# Patient Record
Sex: Female | Born: 1975 | Race: White | Hispanic: No | Marital: Married | Smoking: Never smoker
Health system: Southern US, Community
[De-identification: ages and names within clinical notes are randomized; demographics above are authoritative.]

## PROBLEM LIST (undated history)

## (undated) DIAGNOSIS — I499 Cardiac arrhythmia, unspecified: Secondary | ICD-10-CM

## (undated) DIAGNOSIS — R0602 Shortness of breath: Secondary | ICD-10-CM

## (undated) DIAGNOSIS — R079 Chest pain, unspecified: Secondary | ICD-10-CM

## (undated) DIAGNOSIS — R000 Tachycardia, unspecified: Secondary | ICD-10-CM

## (undated) HISTORY — PX: CHOLECYSTECTOMY: SHX55

## (undated) HISTORY — DX: Chest pain, unspecified: R07.9

## (undated) HISTORY — DX: Shortness of breath: R06.02

## (undated) HISTORY — DX: Tachycardia, unspecified: R00.0

---

## 1998-07-13 ENCOUNTER — Encounter: Payer: Self-pay | Admitting: Obstetrics and Gynecology

## 1998-07-13 ENCOUNTER — Ambulatory Visit (HOSPITAL_COMMUNITY): Admission: RE | Admit: 1998-07-13 | Discharge: 1998-07-13 | Payer: Self-pay | Admitting: Obstetrics and Gynecology

## 1998-08-17 ENCOUNTER — Encounter: Payer: Self-pay | Admitting: Obstetrics and Gynecology

## 1998-08-17 ENCOUNTER — Ambulatory Visit (HOSPITAL_COMMUNITY): Admission: RE | Admit: 1998-08-17 | Discharge: 1998-08-17 | Payer: Self-pay | Admitting: Obstetrics and Gynecology

## 1998-10-19 ENCOUNTER — Ambulatory Visit (HOSPITAL_COMMUNITY): Admission: RE | Admit: 1998-10-19 | Discharge: 1998-10-19 | Payer: Self-pay | Admitting: Obstetrics and Gynecology

## 1998-10-19 ENCOUNTER — Encounter: Payer: Self-pay | Admitting: Obstetrics and Gynecology

## 1998-11-18 ENCOUNTER — Inpatient Hospital Stay (HOSPITAL_COMMUNITY): Admission: AD | Admit: 1998-11-18 | Discharge: 1998-11-18 | Payer: Self-pay | Admitting: Obstetrics and Gynecology

## 1998-12-06 ENCOUNTER — Inpatient Hospital Stay (HOSPITAL_COMMUNITY): Admission: AD | Admit: 1998-12-06 | Discharge: 1998-12-08 | Payer: Self-pay | Admitting: Obstetrics and Gynecology

## 1999-11-25 ENCOUNTER — Emergency Department (HOSPITAL_COMMUNITY): Admission: EM | Admit: 1999-11-25 | Discharge: 1999-11-25 | Payer: Self-pay | Admitting: Emergency Medicine

## 1999-11-25 ENCOUNTER — Emergency Department (HOSPITAL_COMMUNITY): Admission: EM | Admit: 1999-11-25 | Discharge: 1999-11-26 | Payer: Self-pay | Admitting: Emergency Medicine

## 1999-11-27 ENCOUNTER — Encounter: Payer: Self-pay | Admitting: General Practice

## 1999-11-27 ENCOUNTER — Encounter: Admission: RE | Admit: 1999-11-27 | Discharge: 1999-11-27 | Payer: Self-pay | Admitting: General Practice

## 1999-11-29 ENCOUNTER — Encounter: Payer: Self-pay | Admitting: General Surgery

## 1999-11-29 ENCOUNTER — Encounter: Admission: RE | Admit: 1999-11-29 | Discharge: 1999-11-29 | Payer: Self-pay | Admitting: General Surgery

## 1999-12-02 ENCOUNTER — Other Ambulatory Visit: Admission: RE | Admit: 1999-12-02 | Discharge: 1999-12-02 | Payer: Self-pay | Admitting: General Surgery

## 2000-04-10 ENCOUNTER — Other Ambulatory Visit: Admission: RE | Admit: 2000-04-10 | Discharge: 2000-04-10 | Payer: Self-pay | Admitting: Obstetrics and Gynecology

## 2001-04-27 ENCOUNTER — Other Ambulatory Visit: Admission: RE | Admit: 2001-04-27 | Discharge: 2001-04-27 | Payer: Self-pay | Admitting: Obstetrics and Gynecology

## 2001-11-12 ENCOUNTER — Other Ambulatory Visit: Admission: RE | Admit: 2001-11-12 | Discharge: 2001-11-12 | Payer: Self-pay | Admitting: Obstetrics and Gynecology

## 2001-12-22 ENCOUNTER — Ambulatory Visit (HOSPITAL_COMMUNITY): Admission: RE | Admit: 2001-12-22 | Discharge: 2001-12-22 | Payer: Self-pay | Admitting: Obstetrics and Gynecology

## 2001-12-22 ENCOUNTER — Encounter: Payer: Self-pay | Admitting: Obstetrics and Gynecology

## 2002-05-16 ENCOUNTER — Inpatient Hospital Stay (HOSPITAL_COMMUNITY): Admission: AD | Admit: 2002-05-16 | Discharge: 2002-05-19 | Payer: Self-pay | Admitting: Obstetrics and Gynecology

## 2003-08-31 ENCOUNTER — Other Ambulatory Visit: Admission: RE | Admit: 2003-08-31 | Discharge: 2003-08-31 | Payer: Self-pay | Admitting: Obstetrics and Gynecology

## 2003-09-06 ENCOUNTER — Inpatient Hospital Stay (HOSPITAL_COMMUNITY): Admission: AD | Admit: 2003-09-06 | Discharge: 2003-09-06 | Payer: Self-pay | Admitting: Obstetrics and Gynecology

## 2004-03-11 ENCOUNTER — Inpatient Hospital Stay (HOSPITAL_COMMUNITY): Admission: AD | Admit: 2004-03-11 | Discharge: 2004-03-13 | Payer: Self-pay | Admitting: Obstetrics and Gynecology

## 2004-12-12 ENCOUNTER — Emergency Department (HOSPITAL_COMMUNITY): Admission: EM | Admit: 2004-12-12 | Discharge: 2004-12-12 | Payer: Self-pay | Admitting: Emergency Medicine

## 2005-06-06 ENCOUNTER — Other Ambulatory Visit: Admission: RE | Admit: 2005-06-06 | Discharge: 2005-06-06 | Payer: Self-pay | Admitting: Obstetrics and Gynecology

## 2010-08-27 ENCOUNTER — Other Ambulatory Visit: Payer: Self-pay

## 2010-08-27 ENCOUNTER — Emergency Department (HOSPITAL_COMMUNITY)
Admission: EM | Admit: 2010-08-27 | Discharge: 2010-08-27 | Disposition: A | Discharge status: Home / Self Care | Payer: BC Managed Care – PPO | Attending: Emergency Medicine | Admitting: Emergency Medicine

## 2010-08-27 ENCOUNTER — Emergency Department (HOSPITAL_COMMUNITY): Payer: BC Managed Care – PPO

## 2010-08-27 ENCOUNTER — Encounter: Payer: Self-pay | Admitting: Emergency Medicine

## 2010-08-27 DIAGNOSIS — R071 Chest pain on breathing: Secondary | ICD-10-CM | POA: Insufficient documentation

## 2010-08-27 DIAGNOSIS — R0789 Other chest pain: Secondary | ICD-10-CM

## 2010-08-27 HISTORY — DX: Cardiac arrhythmia, unspecified: I49.9

## 2010-08-27 LAB — DIFFERENTIAL
Basophils Absolute: 0.1 10*3/uL (ref 0.0–0.1)
Basophils Relative: 1 % (ref 0–1)
Eosinophils Absolute: 0.1 10*3/uL (ref 0.0–0.7)
Eosinophils Relative: 2 % (ref 0–5)
Lymphocytes Relative: 34 % (ref 12–46)
Lymphs Abs: 2 10*3/uL (ref 0.7–4.0)
Neutro Abs: 3.4 10*3/uL (ref 1.7–7.7)
Neutrophils Relative %: 56 % (ref 43–77)
Promyelocytes Absolute: 0 %

## 2010-08-27 LAB — CBC
Platelets: 275 10*3/uL (ref 150–400)
RBC: 4.53 MIL/uL (ref 3.87–5.11)
WBC: 6 10*3/uL (ref 4.0–10.5)

## 2010-08-27 LAB — COMPREHENSIVE METABOLIC PANEL
AST: 21 U/L (ref 0–37)
Albumin: 3.8 g/dL (ref 3.5–5.2)
Calcium: 9.5 mg/dL (ref 8.4–10.5)
Creatinine, Ser: 0.57 mg/dL (ref 0.50–1.10)
Total Protein: 7.8 g/dL (ref 6.0–8.3)

## 2010-08-27 LAB — CARDIAC PANEL(CRET KIN+CKTOT+MB+TROPI)
Relative Index: INVALID (ref 0.0–2.5)
Total CK: 54 U/L (ref 7–177)
Troponin I: <0.3 ng/mL (ref <0.30)

## 2010-08-27 MED ORDER — IOHEXOL 350 MG/ML SOLN
100.0000 mL | Freq: Once | INTRAVENOUS | Status: AC | PRN
  Administered 2010-08-27: 100 mL via INTRAVENOUS

## 2010-08-27 MED ORDER — SODIUM CHLORIDE 0.9 % IJ SOLN: 3.0000 mL | INTRAMUSCULAR | Status: DC | PRN

## 2010-08-27 MED ORDER — SODIUM CHLORIDE 0.9 % IV SOLN
Freq: Once | INTRAVENOUS | Status: AC
  Administered 2010-08-27: 17:00:00 via INTRAVENOUS

## 2010-08-27 MED ORDER — IBUPROFEN 800 MG PO TABS: 800.0000 mg | ORAL_TABLET | Freq: Three times a day (TID) | ORAL | Status: AC

## 2010-08-27 NOTE — ED Notes (Signed)
INTO ROOM TO SEE PT. RESTING IN BED ON BACK WITH FAMILY AT BS. REMAINS EATING ICE CHIPS. NAD. PAIN 3/10. NO SOB. CALL BELL AT BS. DENIES ANY NEEDS. NO SOB. WILL CONT TO MONITOR.

## 2010-08-27 NOTE — ED Notes (Signed)
EDPA aware of patient's d-dimer, verbal order given for chest CT.

## 2010-08-27 NOTE — ED Notes (Signed)
md at bs with pt to discuss plan of care.

## 2010-08-27 NOTE — ED Notes (Signed)
INTO ROOM TO SEE PT. PT GIVEN ICE CHIPS PER REQUEST. DENIES ANY OTHER NEEDS. PAIN 3/10, BUT NOT CONSTANT. FAMILY AT BS. PT NOT HAVING ANY SOB. NAD. WILL CONT TO MONITOR. TOLERATING ICE CHIPS WELL.

## 2010-08-27 NOTE — ED Notes (Signed)
Patient c/o left side chest pain that radiates into left shoulder blade with left arm numbness. Patient also reported some shortness of breath and nausea but denies any vomiting, light headness, or diaphoresis. Per patient pain started before lunch. Airway patent. Respirations even and non labored, O2 sat 97% on room air. Lung sounds clear.

## 2010-08-27 NOTE — ED Notes (Signed)
Pt back to room from ct. Pt report given to oncoming shift. Assuming care of pt. Into room to introduce self to pt. Pt denies pain when resting. When moving around, pain 1\10. Denies sob. Pt nad at this time. Call bell at bs. Will cont to monitor.

## 2010-08-27 NOTE — ED Provider Notes (Signed)
History     No chief complaint on file.  Patient is a 35 y.o. female presenting with chest pain. The history is provided by the patient and the spouse. No language interpreter was used.  Chest Pain The chest pain began 3 - 5 hours ago. Chest pain occurs constantly. At its most intense, the pain is at 5/10. The pain is currently at 3/10. The quality of the pain is described as pleuritic and sharp. Pertinent negatives for primary symptoms include no fever, no palpitations, no nausea and no vomiting.  Pertinent negatives for associated symptoms include no diaphoresis.   pt states the pain radiates to the L scapula.  Initially it radiates down the L arm also but now the arm simply feels "numb" .  No associated trauma, n/v SOB, diaphoresis or pre-syncopal sx's.  Pain worse with palpation but not with deep inspiration or movement.  No past medical history on file.  No past surgical history on file.  No family history on file.  History  Substance Use Topics  . Smoking status: Not on file  . Smokeless tobacco: Not on file  . Alcohol Use: Not on file    OB History    No data available      Review of Systems  Constitutional: Negative for fever and diaphoresis.  Cardiovascular: Positive for chest pain. Negative for palpitations.  Gastrointestinal: Negative for nausea and vomiting.  All other systems reviewed and are negative.    Physical Exam  There were no vitals taken for this visit.  Physical Exam  Nursing note and vitals reviewed. Constitutional: She is oriented to person, place, and time. Vital signs are normal. She appears well-developed and well-nourished.  HENT:  Head: Normocephalic and atraumatic.  Right Ear: External ear normal.  Left Ear: External ear normal.  Nose: Nose normal.  Mouth/Throat: No oropharyngeal exudate.  Eyes: Conjunctivae and EOM are normal. Pupils are equal, round, and reactive to light. Right eye exhibits no discharge. Left eye exhibits no  discharge. No scleral icterus.  Neck: Normal range of motion. Neck supple. No JVD present. No tracheal deviation present. No thyromegaly present.  Cardiovascular: Normal rate, regular rhythm, normal heart sounds, intact distal pulses and normal pulses.  PMI is not displaced.  Exam reveals no gallop, no friction rub and no decreased pulses.   No murmur heard. Pulmonary/Chest: Effort normal and breath sounds normal. No stridor. No respiratory distress. She has no wheezes. She has no rales. She exhibits tenderness.       + PT in L pectoral area.  Abdominal: Soft. Normal appearance and bowel sounds are normal. She exhibits no distension and no mass. There is no tenderness. There is no rebound and no guarding.  Musculoskeletal: Normal range of motion. She exhibits no edema and no tenderness.  Lymphadenopathy:    She has no cervical adenopathy.  Neurological: She is alert and oriented to person, place, and time. She has normal reflexes. No cranial nerve deficit. Coordination normal. GCS eye subscore is 4. GCS verbal subscore is 5. GCS motor subscore is 6.  Reflex Scores:      Tricep reflexes are 2+ on the right side and 2+ on the left side.      Bicep reflexes are 2+ on the right side and 2+ on the left side.      Brachioradialis reflexes are 2+ on the right side and 2+ on the left side.      Patellar reflexes are 2+ on the right side and 2+  on the left side.      Achilles reflexes are 2+ on the right side and 2+ on the left side. Skin: Skin is warm and dry. No rash noted. She is not diaphoretic.  Psychiatric: She has a normal mood and affect. Her speech is normal and behavior is normal. Judgment and thought content normal. Cognition and memory are normal.    ED Course  Procedures  MDM  Date: 08/27/2010  Rate: 80  Rhythm: normal sinus rhythm  QRS Axis: normal  Intervals: normal  ST/T Wave abnormalities: normal  Conduction Disutrbances:none  Narrative Interpretation:   Old EKG Reviewed:  unchanged    Date: 07/3    Medical screening examination/treatment/procedure(s) were performed by non-physician practitioner and as supervising physician I was immediately available for consultation/collaboration. Osvaldo Human, M.D.  Worthy Rancher, PA 08/27/10 1646  Worthy Rancher, PA 08/27/10 1654  Worthy Rancher, PA 08/27/10 1656  Worthy Rancher, PA 08/27/10 1658  Carleene Cooper III, MD 08/28/10 1739

## 2010-08-28 NOTE — ED Provider Notes (Signed)
History     Chief Complaint  Patient presents with  . Chest Pain   Patient is a 35 y.o. female presenting with chest pain.  Chest Pain     Past Medical History  Diagnosis Date  . Arrhythmia     while pregant with second child    Past Surgical History  Procedure Date  . Cholecystectomy     Family History  Problem Relation Age of Onset  . Heart failure Mother   . Cancer Mother   . Cancer Father     History  Substance Use Topics  . Smoking status: Never Smoker   . Smokeless tobacco: Never Used  . Alcohol Use: 0.6 oz/week    1 Glasses of wine per week     a month    OB History    Grav Para Term Preterm Abortions TAB SAB Ect Mult Living   3 3 2 1      3       Review of Systems  Cardiovascular: Positive for chest pain.    Physical Exam  BP 117/66  Pulse 66  Temp(Src) 97.8 F (36.6 C) (Oral)  Resp 14  Ht 5\' 7"  (1.702 m)  Wt 180 lb (81.647 kg)  BMI 28.19 kg/m2  SpO2 96%  LMP 06/26/2010  Physical Exam  ED Course  Procedures  MDM       Carleene Cooper III, MD 08/29/10 1250

## 2010-12-01 ENCOUNTER — Encounter (HOSPITAL_BASED_OUTPATIENT_CLINIC_OR_DEPARTMENT_OTHER): Payer: Self-pay | Admitting: *Deleted

## 2010-12-01 ENCOUNTER — Emergency Department (HOSPITAL_BASED_OUTPATIENT_CLINIC_OR_DEPARTMENT_OTHER)
Admission: EM | Admit: 2010-12-01 | Discharge: 2010-12-01 | Disposition: A | Discharge status: Home / Self Care | Payer: BC Managed Care – PPO | Attending: Emergency Medicine | Admitting: Emergency Medicine

## 2010-12-01 DIAGNOSIS — Z8719 Personal history of other diseases of the digestive system: Secondary | ICD-10-CM

## 2010-12-01 DIAGNOSIS — J029 Acute pharyngitis, unspecified: Secondary | ICD-10-CM

## 2010-12-01 MED ORDER — DEXAMETHASONE SODIUM PHOSPHATE 10 MG/ML IJ SOLN
10.0000 mg | Freq: Once | INTRAMUSCULAR | Status: AC
  Administered 2010-12-01: 10 mg via INTRAMUSCULAR
  Filled 2010-12-01: qty 1

## 2010-12-01 MED ORDER — IBUPROFEN 100 MG/5ML PO SUSP
ORAL | Status: AC
  Administered 2010-12-01: 800 mg
  Filled 2010-12-01: qty 40

## 2010-12-01 MED ORDER — IBUPROFEN 800 MG PO TABS: 800.0000 mg | ORAL_TABLET | Freq: Once | ORAL | Status: DC

## 2010-12-01 MED ORDER — IBUPROFEN 800 MG PO TABS: 800.0000 mg | ORAL_TABLET | Freq: Three times a day (TID) | ORAL | Status: AC

## 2010-12-01 MED ORDER — HYDROCODONE-ACETAMINOPHEN 5-325 MG PO TABS: 1.0000 | ORAL_TABLET | Freq: Four times a day (QID) | ORAL | Status: AC | PRN

## 2010-12-01 MED ORDER — MAGIC MOUTHWASH W/LIDOCAINE: 15.0000 mL | Freq: Four times a day (QID) | ORAL | Status: AC | PRN

## 2010-12-01 NOTE — ED Notes (Signed)
Pt began having a sore throat on Thursday as well as dry cough pt denies fever or congestion

## 2010-12-01 NOTE — Discharge Instructions (Signed)
Salt Water Gargle This solution will help make your mouth and throat feel better. HOME CARE INSTRUCT  Pharyngitis, Viral and Bacterial Pharyngitis is soreness (inflammation) or infection of the pharynx. It is also called a sore throat. CAUSES  Most sore throats are caused by viruses and are part of a cold. However, some sore throats are caused by strep and other bacteria. Sore throats can also be caused by post nasal drip from draining sinuses, allergies and sometimes from sleeping with an open mouth. Infectious sore throats can be spread from person to person by coughing, sneezing and sharing cups or eating utensils. TREATMENT  Sore throats that are viral usually last 3-4 days. Viral illness will get better without medications (antibiotics). Strep throat and other bacterial infections will usually begin to get better about 24-48 hours after you begin to take antibiotics. HOME CARE INSTRUCTIONS   If the caregiver feels there is a bacterial infection or if there is a positive strep test, they will prescribe an antibiotic. The full course of antibiotics must be taken. If the full course of antibiotic is not taken, you or your child may become ill again. If you or your child has strep throat and do not finish all of the medication, serious heart or kidney diseases may develop.   Drink enough water and fluids to keep your urine clear or pale yellow.   Only take over-the-counter or prescription medicines for pain, discomfort or fever as directed by your caregiver.   Get lots of rest.   Gargle with salt water ( tsp. of salt in a glass of water) as often as every 1-2 hours as you need for comfort.   Hard candies may soothe the throat if individual is not at risk for choking. Throat sprays or lozenges may also be used.  SEEK MEDICAL CARE IF:   Large, tender lumps in the neck develop.   A rash develops.   Green, yellow-brown or bloody sputum is coughed up.   Your baby is older than 3 months  with a rectal temperature of 100.5 F (38.1 C) or higher for more than 1 day.  SEEK IMMEDIATE MEDICAL CARE IF:   A stiff neck develops.   You or your child are drooling or unable to swallow liquids.   You or your child are vomiting, unable to keep medications or liquids down.   You or your child has severe pain, unrelieved with recommended medications.   You or your child are having difficulty breathing (not due to stuffy nose).   You or your child are unable to fully open your mouth.   You or your child develop redness, swelling, or severe pain anywhere on the neck.   You have a fever.   Your baby is older than 3 months with a rectal temperature of 102 F (38.9 C) or higher.   Your baby is 68 months old or younger with a rectal temperature of 100.4 F (38 C) or higher.  MAKE SURE YOU:   Understand these instructions.   Will watch your condition.   Will get help right away if you are not doing well or get worse.  Document Released: 01/13/2005 Document Revised: 09/25/2010 Document Reviewed: 04/12/2007 [employer] Patient Information 2012 [employer]IONS   Mix 1 teaspoon of salt in 8 ounces of warm water.   Gargle with this solution as much or often as you need or as directed. Swish and gargle gently if you have any sores or wounds in your mouth.  Do not swallow this mixture.  Document Released: 10/18/2003 Document Revised: 09/25/2010 Document Reviewed: 03/10/2008 [facility] Patient Information 2012 [place], [place].

## 2010-12-02 LAB — STREP A DNA PROBE: Group A Strep Probe: NEGATIVE

## 2010-12-04 NOTE — ED Provider Notes (Signed)
History     CSN: [id number] Arrival date & time: 12/01/2010  5:37 AM   First MD Initiated Contact with Patient 12/01/10 [phone]      Chief Complaint  Patient presents with  . Sore Throat   HPI  34yoF previously healthy pw sore throat x 3 days. Denies fever, chills. States she can tolerate PO although it hurts. Min dry cough. Denies nasal congestion/rhinorrhea. No sick contacts but does have school age children at home. No rash. Denies abd pain/n/v/cp/sob. Denies headache/dizziness. Has not taken anything at home for pain pta.     Lysbeth Penner, RN 12/01/2010 05:40     Pt began having a sore throat on Thursday as well as dry cough pt denies fever or congestion      Past Medical History  Diagnosis Date  . Arrhythmia     while pregant with second child    Past Surgical History  Procedure Date  . Cholecystectomy     Family History  Problem Relation Age of Onset  . Heart failure Mother   . Cancer Mother   . Cancer Father     History  Substance Use Topics  . Smoking status: Never Smoker   . Smokeless tobacco: Never Used  . Alcohol Use: 0.6 oz/week    1 Glasses of wine per week     a month    OB History    Grav Para Term Preterm Abortions TAB SAB Ect Mult Living   3 3 2 1      3       Review of Systems  All other systems reviewed and are negative.   except as noted HPI   Allergies  Bee and Codeine  Home Medications   Current Outpatient Rx  Name Route Sig Dispense Refill  . MAGIC MOUTHWASH W/LIDOCAINE Oral Take 15 mLs by mouth 4 (four) times daily as needed. 100 mL 0  . HYDROCODONE-ACETAMINOPHEN 5-325 MG PO TABS Oral Take 1 tablet by mouth every 6 (six) hours as needed for pain. 30 tablet 0  . IBUPROFEN 800 MG PO TABS Oral Take 1 tablet (800 mg total) by mouth 3 (three) times daily. 21 tablet 0  . NORGESTIMATE-ETH ESTRADIOL 0.25-35 MG-MCG PO TABS Oral Take 1 tablet by mouth daily.        BP 137/88  Pulse 91  Temp(Src) 99.5 F (37.5 C) (Oral)   Resp 18  SpO2 100%  LMP 09/24/2010  Physical Exam  Nursing note and vitals reviewed. Constitutional: She is oriented to person, place, and time. She appears well-developed.  HENT:  Head: Atraumatic.  Mouth/Throat: No oropharyngeal exudate.       2+ tonsillar swelling with mild erythema Uvula midline No exudates No muffled voice No trismus  Tiny ulcers scattered on gingiva No ulcers noted soft palate   Eyes: Conjunctivae and EOM are normal. Pupils are equal, round, and reactive to light.  Neck: Normal range of motion. Neck supple.  Cardiovascular: Normal rate, regular rhythm, normal heart sounds and intact distal pulses.   Pulmonary/Chest: Effort normal and breath sounds normal. No respiratory distress. She has no wheezes. She has no rales.  Abdominal: Soft. She exhibits no distension. There is no tenderness. There is no rebound and no guarding.  Musculoskeletal: Normal range of motion.  Lymphadenopathy:    She has no cervical adenopathy.  Neurological: She is alert and oriented to person, place, and time.  Skin: Skin is warm and dry. No rash noted.  Psychiatric: She has a normal  mood and affect.    ED Course  Procedures (including critical care time)   Labs Reviewed  RAPID STREP SCREEN  STREP A DNA PROBE   No results found.   1. Pharyngitis   2. History of oral aphthous ulcers       MDM  Pharyngitis without concern for suppurative complications as this time. Strep negative. Culture sent. Home with magic mouthwash, pain control. Precautions for return.  Stefano Gaul, MD         Forbes Cellar, MD 12/04/10 [phone]

## 2013-11-28 ENCOUNTER — Encounter (HOSPITAL_BASED_OUTPATIENT_CLINIC_OR_DEPARTMENT_OTHER): Payer: Self-pay | Admitting: *Deleted

## 2014-06-19 ENCOUNTER — Encounter (HOSPITAL_COMMUNITY): Payer: Self-pay | Admitting: Emergency Medicine

## 2014-06-19 ENCOUNTER — Emergency Department (HOSPITAL_COMMUNITY): Payer: 59

## 2014-06-19 ENCOUNTER — Emergency Department (HOSPITAL_COMMUNITY)
Admission: EM | Admit: 2014-06-19 | Discharge: 2014-06-19 | Disposition: A | Discharge status: Home / Self Care | Payer: 59 | Attending: Emergency Medicine | Admitting: Emergency Medicine

## 2014-06-19 DIAGNOSIS — Z8679 Personal history of other diseases of the circulatory system: Secondary | ICD-10-CM | POA: Insufficient documentation

## 2014-06-19 DIAGNOSIS — R202 Paresthesia of skin: Secondary | ICD-10-CM | POA: Insufficient documentation

## 2014-06-19 DIAGNOSIS — R200 Anesthesia of skin: Secondary | ICD-10-CM | POA: Diagnosis present

## 2014-06-19 LAB — COMPREHENSIVE METABOLIC PANEL
ALK PHOS: 55 U/L (ref 38–126)
ALT: 22 U/L (ref 14–54)
AST: 23 U/L (ref 15–41)
Albumin: 4.4 g/dL (ref 3.5–5.0)
Anion gap: 8 (ref 5–15)
BUN: 18 mg/dL (ref 6–20)
CALCIUM: 8.9 mg/dL (ref 8.9–10.3)
CO2: 25 mmol/L (ref 22–32)
Chloride: 105 mmol/L (ref 101–111)
Creatinine, Ser: 0.7 mg/dL (ref 0.44–1.00)
GFR calc non Af Amer: >60 mL/min (ref >60)
GLUCOSE: 95 mg/dL (ref 65–99)
Potassium: 3.8 mmol/L (ref 3.5–5.1)
Sodium: 138 mmol/L (ref 135–145)
Total Bilirubin: 0.9 mg/dL (ref 0.3–1.2)
Total Protein: 7.1 g/dL (ref 6.5–8.1)

## 2014-06-19 LAB — CBC
HCT: 41.6 % (ref 36.0–46.0)
HEMOGLOBIN: 13.9 g/dL (ref 12.0–15.0)
MCH: 30.1 pg (ref 26.0–34.0)
MCHC: 33.4 g/dL (ref 30.0–36.0)
MCV: 90 fL (ref 78.0–100.0)
PLATELETS: 246 10*3/uL (ref 150–400)
RBC: 4.62 MIL/uL (ref 3.87–5.11)
RDW: 12.7 % (ref 11.5–15.5)
WBC: 6 10*3/uL (ref 4.0–10.5)

## 2014-06-19 LAB — DIFFERENTIAL
BASOS ABS: 0 10*3/uL (ref 0.0–0.1)
Basophils Relative: 1 % (ref 0–1)
EOS ABS: 0.2 10*3/uL (ref 0.0–0.7)
Eosinophils Relative: 3 % (ref 0–5)
Lymphocytes Relative: 24 % (ref 12–46)
Lymphs Abs: 1.4 10*3/uL (ref 0.7–4.0)
MONOS PCT: 6 % (ref 3–12)
Monocytes Absolute: 0.3 10*3/uL (ref 0.1–1.0)
NEUTROS ABS: 4 10*3/uL (ref 1.7–7.7)
Neutrophils Relative %: 66 % (ref 43–77)

## 2014-06-19 LAB — I-STAT TROPONIN, ED: Troponin i, poc: 0 ng/mL (ref 0.00–0.08)

## 2014-06-19 LAB — CBG MONITORING, ED: GLUCOSE-CAPILLARY: 93 mg/dL (ref 65–99)

## 2014-06-19 LAB — APTT: APTT: 32 s (ref 24–37)

## 2014-06-19 LAB — PROTIME-INR
INR: 0.99 (ref 0.00–1.49)
Prothrombin Time: 13.3 seconds (ref 11.6–15.2)

## 2014-06-19 NOTE — ED Notes (Signed)
Numbness and tingling in fingers and hands, started at 6 am.  Visual changes noted on Friday morning, but not currently.  Denies any pain at this time.  Denies weakness but some dizziness.

## 2014-06-19 NOTE — ED Notes (Signed)
POC CBG result: 93

## 2014-06-19 NOTE — ED Provider Notes (Signed)
CSN: [id number]     Arrival date & time 06/19/14  1016 History  This chart was scribed for Linwood Dibbles, MD by Leona Carry, ED Scribe. The patient was seen in [facility]. The patient's care was started at 10:43 AM.   Chief Complaint  Patient presents with  . Numbness   The history is provided by the patient. No language interpreter was used.   HPI Comments: Patricia Simon is a 39 y.o. female who presents to the Emergency Department complaining of bilateral numbness and tingling in her fingers and hands. Patient reports that these symptoms onset when she woke up this morning at 6:00 AM. She states that she did not experience these symptoms last night. She reports that she had difficulty gripping the steering wheel while driving to work. Patient measured her blood pressure at work as 158/111. She denies trouble with her speech, dizziness, or weakness.   Patient reports that she experienced an episode of visual disturbance two days ago. She is not currently experiencing similar visual problems.   Past Medical History  Diagnosis Date  . Arrhythmia     while pregant with second child   Past Surgical History  Procedure Laterality Date  . Cholecystectomy     Family History  Problem Relation Age of Onset  . Heart failure Mother   . Cancer Mother   . Cancer Father    History  Substance Use Topics  . Smoking status: Never Smoker   . Smokeless tobacco: Never Used  . Alcohol Use: 0.6 oz/week    1 Glasses of wine per week     Comment: a month   OB History    Gravida Para Term Preterm AB TAB SAB Ectopic Multiple Living   Review of Systems  Eyes: Negative for visual disturbance.  Neurological: Positive for numbness. Negative for dizziness, facial asymmetry and weakness.  All other systems reviewed and are negative.     Allergies  Nutritional supplements and Codeine  Home Medications   Prior to Admission medications   Medication Sig Start Date End  Date Taking? Authorizing Provider  Alum & Mag Hydroxide-Simeth (MAGIC MOUTHWASH W/LIDOCAINE) SOLN Take 15 mLs by mouth 4 (four) times daily as needed. Patient not taking: Reported on 06/19/2014 12/01/10   Forbes Cellar, MD   BP 121/74 mmHg  Pulse 65  Temp(Src) 98 F (36.7 C) (Oral)  Resp 20  Ht  (1.702 m)  Wt 190 lb (86.183 kg)  BMI 29.75 kg/m2  SpO2 99% Physical Exam  Constitutional: She is oriented to person, place, and time. She appears well-developed and well-nourished. No distress.  HENT:  Head: Normocephalic and atraumatic.  Right Ear: External ear normal.  Left Ear: External ear normal.  Mouth/Throat: Oropharynx is clear and moist.  Eyes: Conjunctivae are normal. Right eye exhibits no discharge. Left eye exhibits no discharge. No scleral icterus.  Neck: Neck supple. No tracheal deviation present.  Cardiovascular: Normal rate, regular rhythm and intact distal pulses.   Pulmonary/Chest: Effort normal and breath sounds normal. No stridor. No respiratory distress. She has no wheezes. She has no rales.  Abdominal: Soft. Bowel sounds are normal. She exhibits no distension. There is no tenderness. There is no rebound and no guarding.  Musculoskeletal: She exhibits no edema or tenderness.  Neurological: She is alert and oriented to person, place, and time. She has normal strength. No cranial nerve deficit (No facial droop, extraocular movements  intact, tongue midline ) or sensory deficit. She exhibits normal muscle tone. She displays no seizure activity. Coordination normal.  No pronator drift bilateral upper extrem, able to hold both legs off bed for 5 seconds, sensation intact in all extremities, no visual field cuts, no left or right sided neglect, normal finger-nose exam bilaterally, no nystagmus noted   Skin: Skin is warm and dry. No rash noted.  Psychiatric: She has a normal mood and affect.  Nursing note and vitals reviewed.   ED Course  Procedures (including critical  care time) Labs Review Labs Reviewed  PROTIME-INR  APTT  CBC  DIFFERENTIAL  COMPREHENSIVE METABOLIC PANEL  CBG MONITORING, ED  I-STAT TROPOININ, ED    Imaging Review Ct Head Wo Contrast  06/19/2014   CLINICAL DATA:  Numbness and tingling bilateral hands starting this morning 6 a.m.  EXAM: CT HEAD WITHOUT CONTRAST  TECHNIQUE: Contiguous axial images were obtained from the base of the skull through the vertex without intravenous contrast.  COMPARISON:  None.  FINDINGS: No skull fracture is noted. Paranasal sinuses and mastoid air cells are unremarkable. No intracranial hemorrhage, mass effect or midline shift. No acute cortical infarction. No hydrocephalus. The gray and white-matter differentiation is preserved. No mass lesion is noted on this unenhanced scan.  IMPRESSION: No acute intracranial abnormality.   Electronically Signed   By: Natasha MeadLiviu  Pop M.D.   On: 06/19/2014 11:46     EKG Interpretation   Date/Time:  Monday Jun 19 2014 10:33:18 EDT Ventricular Rate:  68 PR Interval:  151 QRS Duration: 77 QT Interval:  374 QTC Calculation: 398 R Axis:   18 Text Interpretation:  Sinus rhythm Low voltage, precordial leads No  significant change since last tracing Confirmed by Rogers Ditter  MD-J, Katalea Ucci  ([id number]) on 06/19/2014 12:16:09 PM      MDM   Final diagnoses:  Tingling in extremities   I discussed the findings with the patient and the family. Her symptoms involved both upper extremities. The bilateral nature of the symptoms argues against stroke. Patient is also low risk.  Neurologic exam is entirely normal in the emergency department.  The etiology of the paresthesias is unclear but I do think not think she needs any further evaluation at this time.  Encouraged close follow up with pcp and discussed warning signs and precautions.  At this time there does not appear to be any evidence of an acute emergency medical condition and the patient appears stable for discharge with appropriate  outpatient follow up.   I personally performed the services described in this documentation, which was scribed in my presence.  The recorded information has been reviewed and is accurate.    Linwood DibblesJon Prateek Knipple, MD 06/19/14 1226

## 2014-06-19 NOTE — ED Notes (Signed)
Patient with no complaints at this time. Respirations even and unlabored. Skin warm/dry. Discharge instructions reviewed with patient at this time. Patient given opportunity to voice concerns/ask questions. Patient discharged at this time and left Emergency Department with steady gait.   

## 2014-06-19 NOTE — Discharge Instructions (Signed)

## 2014-06-19 NOTE — ED Notes (Signed)
MD at bedside. 

## 2014-06-19 NOTE — ED Notes (Signed)
Pt reports bilateral hand numbness/tingling since 6 am this am. Pt reports dizziness started x1 hour ago. Speech clear. Pt reports blurred vision on Friday but denies any vision problems at this time. nad noted. Facial symmetrical.

## 2016-06-07 IMAGING — CT CT HEAD W/O CM
1 series · 16 of 30 positions shown, 20 images · non-contrast
Comparison: None.

CLINICAL DATA: Numbness and tingling bilateral hands starting this
morning 6 a.m.

EXAM:
CT HEAD WITHOUT CONTRAST
TECHNIQUE: Contiguous axial images were obtained from the base of the skull
through the vertex without intravenous contrast.

[Series 2: headseq 4.8 h37s · axial · 0.43mm/px · z∈[+89,+244]mm · 16 of 36 slices shown, 20 images]
[im 2/36  brain]
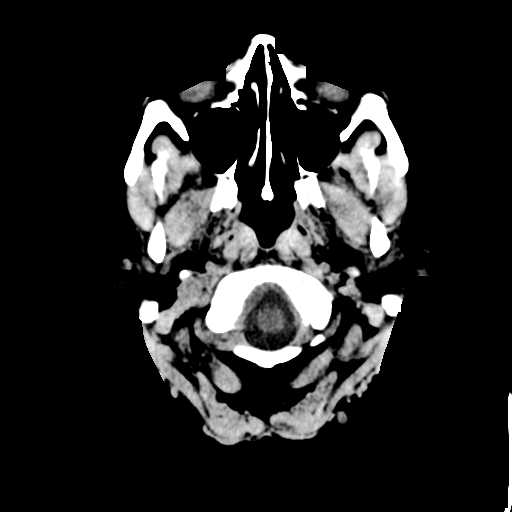
[im 2/36  bone]
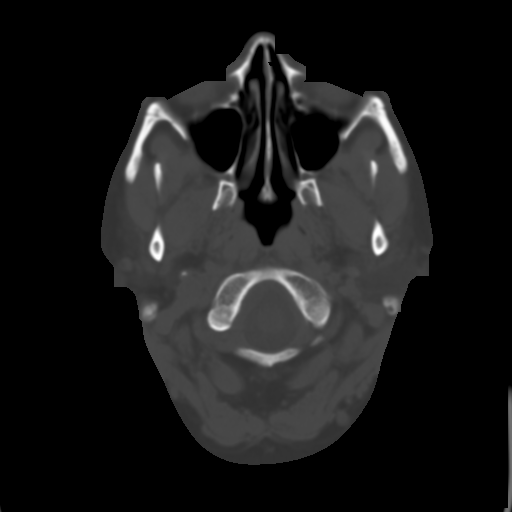
[im 4/36  brain]
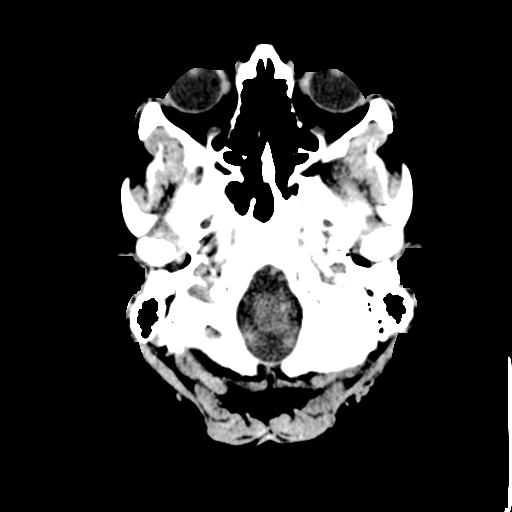
[im 7/36  brain]
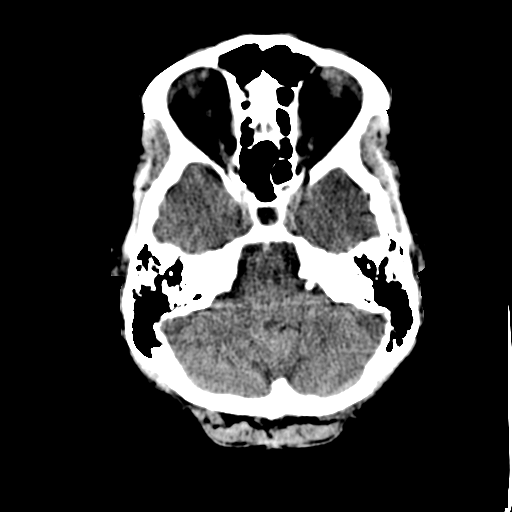
[im 9/36  brain]
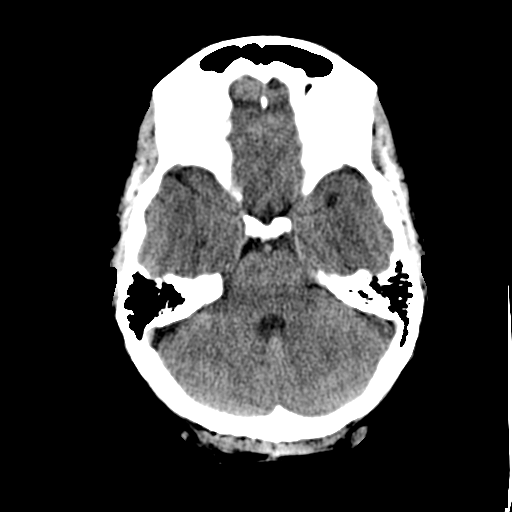
[im 10/36  brain]
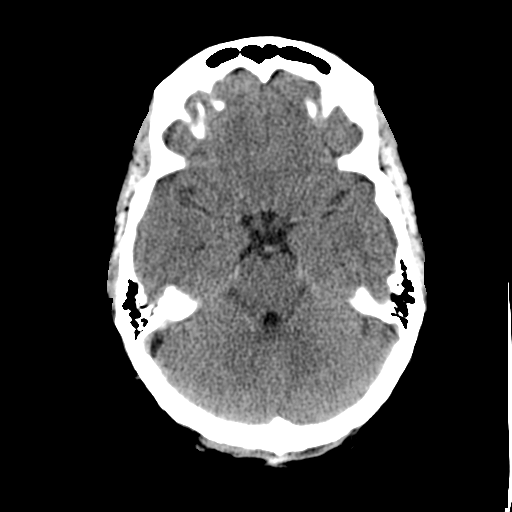
[im 10/36  bone]
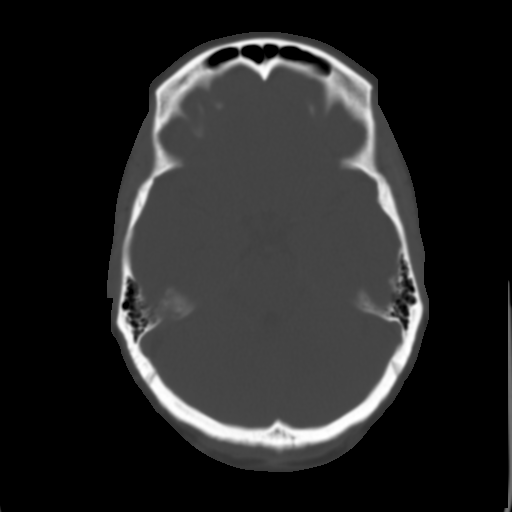
[im 13/36  brain]
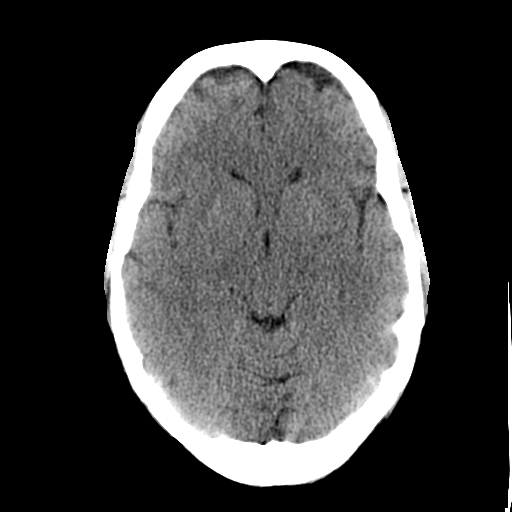
[im 15/36  brain]
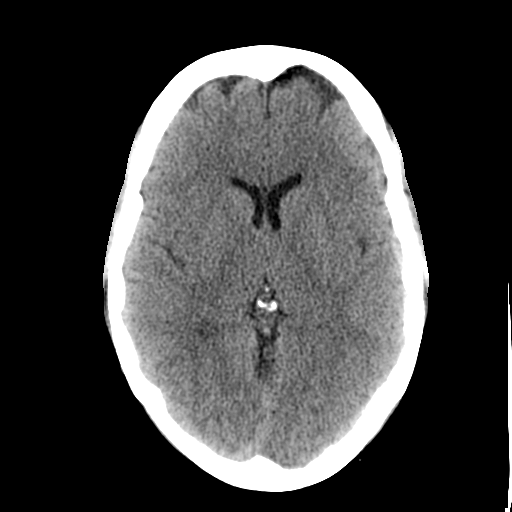
[im 17/36  brain]
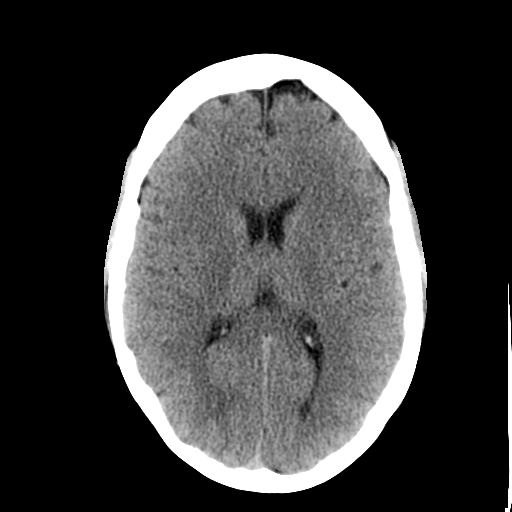
[im 19/36  brain]
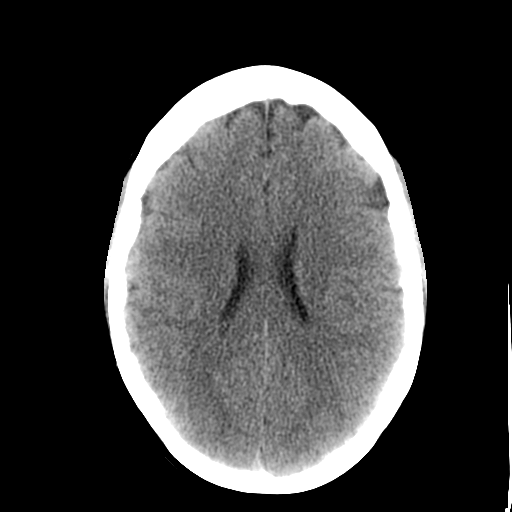
[im 19/36  bone]
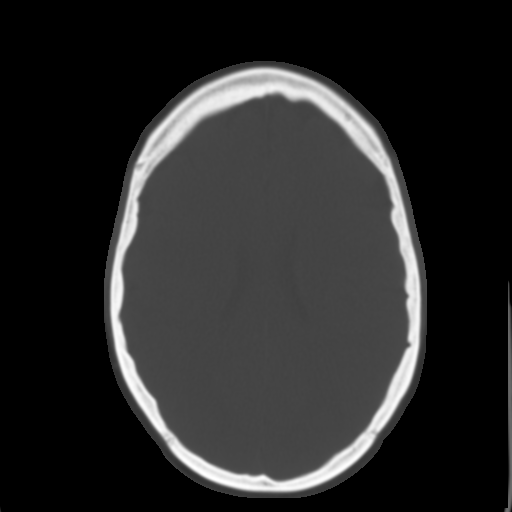
[im 21/36  brain]
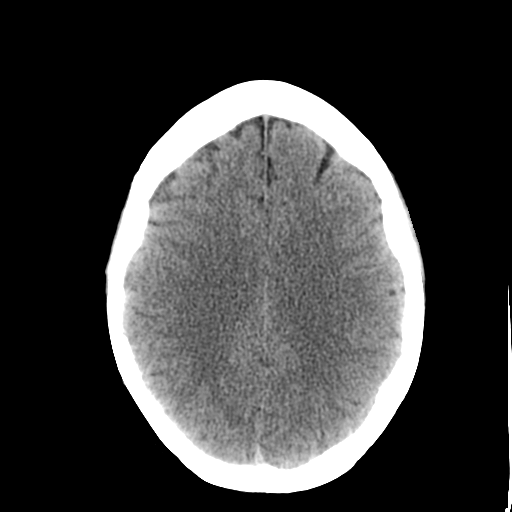
[im 23/36  brain]
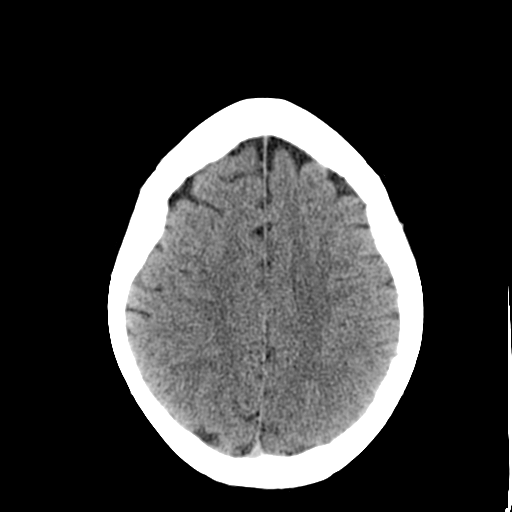
[im 26/36  brain]
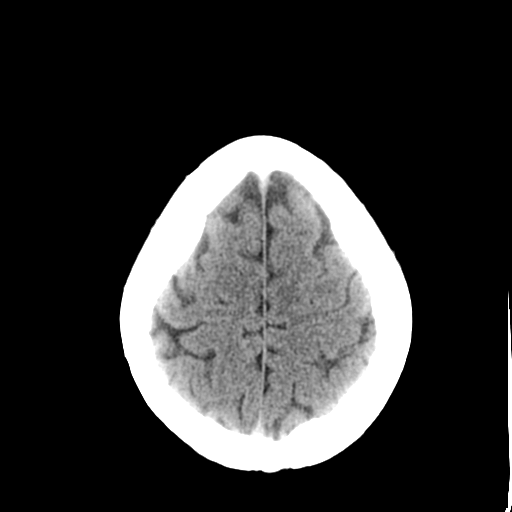
[im 27/36  brain]
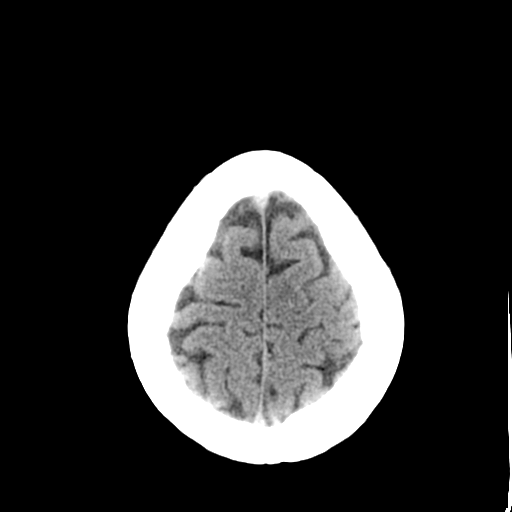
[im 27/36  bone]
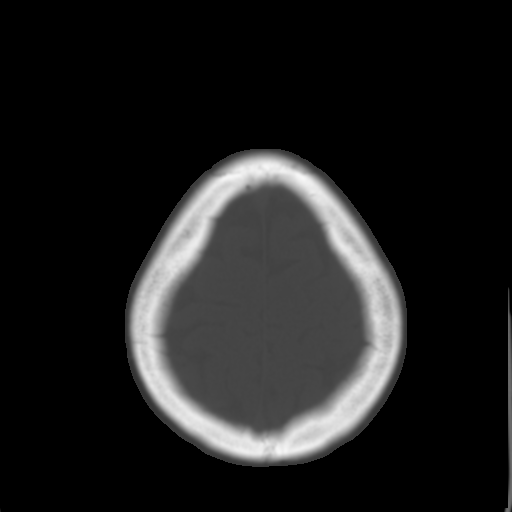
[im 29/36  brain]
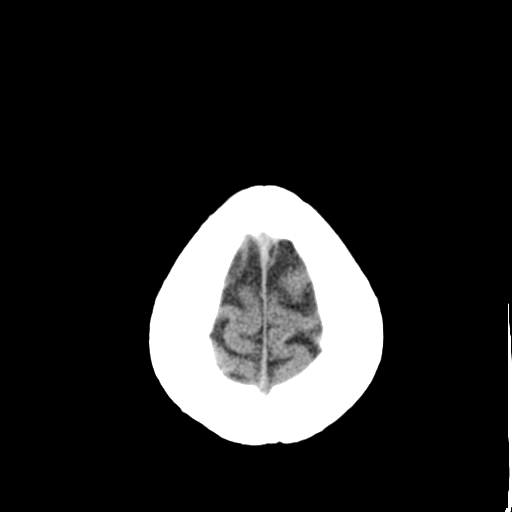
[im 32/36  brain]
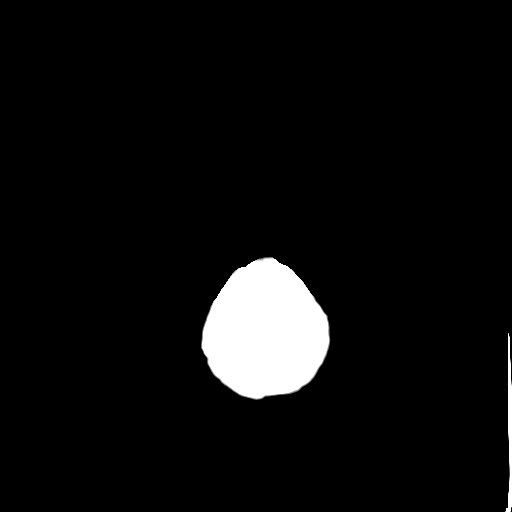
[im 34/36  brain]
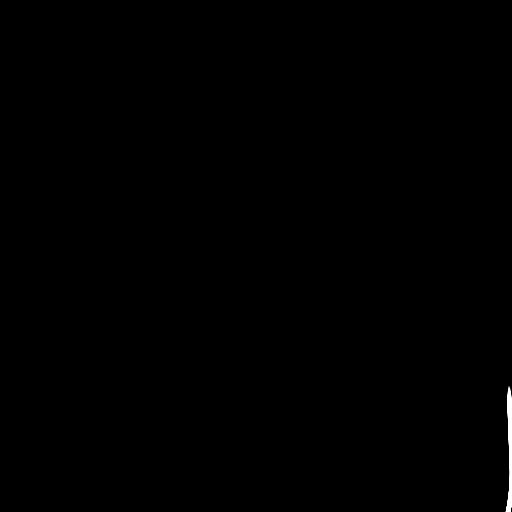

[16 of 30 positions shown; findings below may reference images not displayed]

FINDINGS: No skull fracture is noted. Paranasal sinuses and mastoid air cells
are unremarkable. No intracranial hemorrhage, mass effect or midline
shift. No acute cortical infarction. No hydrocephalus. The gray and
white-matter differentiation is preserved. No mass lesion is noted
on this unenhanced scan.
IMPRESSION: No acute intracranial abnormality.

## 2021-07-15 ENCOUNTER — Emergency Department (HOSPITAL_BASED_OUTPATIENT_CLINIC_OR_DEPARTMENT_OTHER)
Admission: EM | Admit: 2021-07-15 | Discharge: 2021-07-16 | Disposition: A | Discharge status: Home / Self Care | Payer: Commercial Managed Care - PPO | Attending: Emergency Medicine | Admitting: Emergency Medicine

## 2021-07-15 ENCOUNTER — Emergency Department (HOSPITAL_BASED_OUTPATIENT_CLINIC_OR_DEPARTMENT_OTHER): Payer: Commercial Managed Care - PPO | Admitting: Radiology

## 2021-07-15 ENCOUNTER — Other Ambulatory Visit: Payer: Self-pay

## 2021-07-15 ENCOUNTER — Encounter (HOSPITAL_BASED_OUTPATIENT_CLINIC_OR_DEPARTMENT_OTHER): Payer: Self-pay

## 2021-07-15 DIAGNOSIS — R519 Headache, unspecified: Secondary | ICD-10-CM

## 2021-07-15 DIAGNOSIS — I10 Essential (primary) hypertension: Secondary | ICD-10-CM | POA: Insufficient documentation

## 2021-07-15 DIAGNOSIS — R0789 Other chest pain: Secondary | ICD-10-CM | POA: Insufficient documentation

## 2021-07-15 LAB — CBC WITH DIFFERENTIAL/PLATELET
Abs Immature Granulocytes: 0.03 10*3/uL (ref 0.00–0.07)
Basophils Absolute: 0.1 10*3/uL (ref 0.0–0.1)
Basophils Relative: 1 %
Eosinophils Absolute: 0.1 10*3/uL (ref 0.0–0.5)
Eosinophils Relative: 2 %
HCT: 43.3 % (ref 36.0–46.0)
Hemoglobin: 14.5 g/dL (ref 12.0–15.0)
Immature Granulocytes: 0 %
Lymphocytes Relative: 32 %
Lymphs Abs: 2.3 10*3/uL (ref 0.7–4.0)
MCH: 29.8 pg (ref 26.0–34.0)
MCHC: 33.5 g/dL (ref 30.0–36.0)
MCV: 88.9 fL (ref 80.0–100.0)
Monocytes Absolute: 0.5 10*3/uL (ref 0.1–1.0)
Monocytes Relative: 7 %
Neutro Abs: 4.1 10*3/uL (ref 1.7–7.7)
Neutrophils Relative %: 58 %
Platelets: 257 10*3/uL (ref 150–400)
RBC: 4.87 MIL/uL (ref 3.87–5.11)
RDW: 12.6 % (ref 11.5–15.5)
WBC: 7.1 10*3/uL (ref 4.0–10.5)
nRBC: 0 % (ref 0.0–0.2)

## 2021-07-15 LAB — BASIC METABOLIC PANEL
Anion gap: 10 (ref 5–15)
BUN: 16 mg/dL (ref 6–20)
CO2: 25 mmol/L (ref 22–32)
Calcium: 9.7 mg/dL (ref 8.9–10.3)
Chloride: 106 mmol/L (ref 98–111)
Creatinine, Ser: 0.82 mg/dL (ref 0.44–1.00)
GFR, Estimated: >60 mL/min (ref >60)
Glucose, Bld: 110 mg/dL — ABNORMAL HIGH (ref 70–99)
Potassium: 3.6 mmol/L (ref 3.5–5.1)
Sodium: 141 mmol/L (ref 135–145)

## 2021-07-15 LAB — TROPONIN I (HIGH SENSITIVITY)
Troponin I (High Sensitivity): 2 ng/L (ref <18)
Troponin I (High Sensitivity): 2 ng/L (ref <18)

## 2021-07-15 MED ORDER — SODIUM CHLORIDE 0.9 % IV SOLN: INTRAVENOUS | Status: DC

## 2021-07-15 MED ORDER — DIPHENHYDRAMINE HCL 50 MG/ML IJ SOLN
25.0000 mg | Freq: Once | INTRAMUSCULAR | Status: AC
  Administered 2021-07-15: 25 mg via INTRAVENOUS
  Filled 2021-07-15: qty 1

## 2021-07-15 MED ORDER — METOCLOPRAMIDE HCL 5 MG/ML IJ SOLN
10.0000 mg | Freq: Once | INTRAMUSCULAR | Status: AC
Start: 2021-07-15 — End: 2021-07-15
  Administered 2021-07-15: 10 mg via INTRAVENOUS
  Filled 2021-07-15: qty 2

## 2021-07-15 MED ORDER — SODIUM CHLORIDE 0.9 % IV BOLUS
1000.0000 mL | Freq: Once | INTRAVENOUS | Status: AC
  Administered 2021-07-15: 1000 mL via INTRAVENOUS

## 2021-07-15 MED ORDER — DEXAMETHASONE SODIUM PHOSPHATE 10 MG/ML IJ SOLN
10.0000 mg | Freq: Once | INTRAMUSCULAR | Status: AC
  Administered 2021-07-15: 10 mg via INTRAVENOUS
  Filled 2021-07-15: qty 1

## 2021-07-15 NOTE — ED Triage Notes (Addendum)
Pt reports being Hypertensive, fatigued and headache.  Also c/o left shoulder pain that radiates to her neck Worsening symptoms started today

## 2021-07-15 NOTE — ED Notes (Signed)
[facility] arrived to transfer pt. Pt stable at time of departure 

## 2021-07-15 NOTE — ED Provider Notes (Addendum)
[facility] EMERGENCY DEPT Provider Note   CSN: [id number] Arrival date & time: 07/15/21  1755     History  Chief Complaint  Patient presents with   Hypertension    Patricia Simon is a 46 y.o. female.  Patient presenting with multiple complaints.  Patient had blood work done this morning.  They noted her blood pressure was really high I told her to go home and rest and have it followed up.  Patient also awoke this morning with a frontal headache left side greater than right, radiates to the side on the left side.  Associated with some photophobia.  No nausea no vomiting also with some left anterior chest pain that radiates to the shoulder area and radiates to her neck.  The chest pain started upon arrival here at 1800.  Patient is never had chest pain like this before.  Patient has had migraine headaches in the past but not recently said this does not really remind her of her past migraines.  Blood pressure upon arrival here was 165/116.  Currently blood pressure is 130/93.  Without any intervention patient does not have a history of hypertension.  Past medical history only significant for arrhythmias while pregnant with second child.  None since.  Patient's had her gallbladder removed.       Home Medications Prior to Admission medications   Medication Sig Start Date End Date Taking? Authorizing Provider  Alum & Mag Hydroxide-Simeth (MAGIC MOUTHWASH W/LIDOCAINE) SOLN Take 15 mLs by mouth 4 (four) times daily as needed. Patient not taking: Reported on 06/19/2014 12/01/10   Forbes Cellar, MD      Allergies    Nutritional supplements and Codeine    Review of Systems   Review of Systems  Constitutional:  Negative for chills and fever.  HENT:  Negative for congestion, ear pain and sore throat.   Eyes:  Positive for photophobia. Negative for pain and visual disturbance.  Respiratory:  Negative for cough and shortness of breath.   Cardiovascular:  Positive for  chest pain. Negative for palpitations.  Gastrointestinal:  Negative for abdominal pain and vomiting.  Genitourinary:  Negative for dysuria and hematuria.  Musculoskeletal:  Negative for arthralgias, back pain and neck stiffness.  Skin:  Negative for color change and rash.  Neurological:  Positive for headaches. Negative for dizziness, seizures, syncope, facial asymmetry, speech difficulty, weakness and numbness.  All other systems reviewed and are negative.   Physical Exam Updated Vital Signs BP 125/88   Pulse 70   Temp 98.7 F (37.1 C)   Resp 18   Ht 1.702 m (5\' 7" )   Wt 95.7 kg   SpO2 99%   BMI 33.05 kg/m  Physical Exam Vitals and nursing note reviewed.  Constitutional:      General: She is not in acute distress.    Appearance: Normal appearance. She is well-developed. She is not toxic-appearing or diaphoretic.  HENT:     Head: Normocephalic and atraumatic.  Eyes:     Extraocular Movements: Extraocular movements intact.     Conjunctiva/sclera: Conjunctivae normal.     Pupils: Pupils are equal, round, and reactive to light.  Cardiovascular:     Rate and Rhythm: Normal rate and regular rhythm.     Heart sounds: No murmur heard. Pulmonary:     Effort: Pulmonary effort is normal. No respiratory distress.     Breath sounds: Normal breath sounds.  Abdominal:     Palpations: Abdomen is soft.     Tenderness:  There is no abdominal tenderness.  Musculoskeletal:        General: No swelling.     Cervical back: Normal range of motion. No rigidity or tenderness.  Skin:    General: Skin is warm and dry.     Capillary Refill: Capillary refill takes less than 2 seconds.  Neurological:     General: No focal deficit present.     Mental Status: She is alert and oriented to person, place, and time.     Cranial Nerves: No cranial nerve deficit.     Sensory: No sensory deficit.     Motor: No weakness.  Psychiatric:        Mood and Affect: Mood normal.     ED Results / Procedures  / Treatments   Labs (all labs ordered are listed, but only abnormal results are displayed) Labs Reviewed  BASIC METABOLIC PANEL - Abnormal; Notable for the following components:      Result Value   Glucose, Bld 110 (*)    All other components within normal limits  CBC WITH DIFFERENTIAL/PLATELET  TROPONIN I (HIGH SENSITIVITY)  TROPONIN I (HIGH SENSITIVITY)    EKG EKG Interpretation  Date/Time:  Monday July 15 2021 18:07:03 EDT Ventricular Rate:  89 PR Interval:  150 QRS Duration: 72 QT Interval:  338 QTC Calculation: 411 R Axis:   1 Text Interpretation: Normal sinus rhythm Normal ECG When compared with ECG of 19-Jun-2014 10:33, PREVIOUS ECG IS PRESENT Confirmed by Vanetta Mulders [phone]) on 07/15/2021 9:52:16 PM  Radiology DG Chest 2 View  Result Date: 07/15/2021 CLINICAL DATA:  High blood pressure today. EXAM: CHEST - 2 VIEW COMPARISON:  August 27, 2010 FINDINGS: The heart size and mediastinal contours are within normal limits. Both lungs are clear. Scoliosis of spine noted. IMPRESSION: No active cardiopulmonary disease. Electronically Signed   By: Sherian Rein M.D.   On: 07/15/2021 18:26    Procedures Procedures    Medications Ordered in ED Medications  0.9 %  sodium chloride infusion (has no administration in time range)  sodium chloride 0.9 % bolus 1,000 mL (1,000 mLs Intravenous New Bag/Given 07/15/21 2206)  diphenhydrAMINE (BENADRYL) injection 25 mg (25 mg Intravenous Given 07/15/21 2208)  dexamethasone (DECADRON) injection 10 mg (10 mg Intravenous Given 07/15/21 2208)  metoCLOPramide (REGLAN) injection 10 mg (10 mg Intravenous Given 07/15/21 2208)    ED Course/ Medical Decision Making/ A&P                           Medical Decision Making Amount and/or Complexity of Data Reviewed Labs: ordered. Radiology: ordered.  Risk Prescription drug management.   Patient presenting with multiple complaints.  Initially had elevated blood pressure apparently was elevated  this morning.  Without any intervention it is improved significantly currently 130/93.  Patient with a headache not typical for migraines for her she has not had a migraine headache for a long period of time.  She awoke with a headache this morning.  It has been constant throughout the day.  Patient states it is 8 out of 10.  Associated with some photophobia.  No neck stiffness.  No fevers.  No numbness no weakness no speech problems.  Patient's initial troponin was normal basic metabolic panel is normal CBC no leukocytosis hemoglobin 14.5.  Chest x-ray without any acute findings.  EKG without any acute changes.  Basic normal sinus rhythm.  Delta troponins pending.  I decided to go ahead and treat her  with a migraine cocktail.  But we will plan to send her into [facility] for a CT head.  Since this is different than her migraines.  Side treated with migraine cocktail just so that she can get some relief in the meantime.  We will make this arrangement once we know the delta troponin.  Regarding the elevated blood pressure patient can follow-up with her primary care doctor and have that trended.  Delta troponin was normal.  Clinically I feel if the head CT is negative patient can be discharged home.  Patient already receiving her migraine cocktail.  Patient without any concerns for meningitis.  Patient will go to [facility] head CT via [facility].  Accepting Dr Wyvonnia Dusky  Final Clinical Impression(s) / ED Diagnoses Final diagnoses:  Acute intractable headache, unspecified headache type  Atypical chest pain  Hypertension, unspecified type    Rx / DC Orders ED Discharge Orders     None         Fredia Sorrow, MD 07/15/21 2223    Fredia Sorrow, MD 07/15/21 2226

## 2021-07-15 NOTE — Discharge Instructions (Signed)
Follow-up with your primary care doctor for trending of your blood pressure.  Also follow-up with them regarding the atypical chest pain.

## 2021-07-16 ENCOUNTER — Emergency Department (HOSPITAL_COMMUNITY): Payer: Commercial Managed Care - PPO

## 2021-07-16 MED ORDER — DIPHENHYDRAMINE HCL 50 MG/ML IJ SOLN: 12.5000 mg | Freq: Once | INTRAMUSCULAR | Status: DC

## 2021-08-16 ENCOUNTER — Ambulatory Visit
Admission: EM | Admit: 2021-08-16 | Discharge: 2021-08-16 | Disposition: A | Discharge status: Home / Self Care | Payer: Commercial Managed Care - PPO

## 2021-08-16 DIAGNOSIS — Z8679 Personal history of other diseases of the circulatory system: Secondary | ICD-10-CM | POA: Diagnosis not present

## 2021-08-16 DIAGNOSIS — R0602 Shortness of breath: Secondary | ICD-10-CM | POA: Diagnosis not present

## 2021-08-16 DIAGNOSIS — R000 Tachycardia, unspecified: Secondary | ICD-10-CM | POA: Diagnosis not present

## 2021-08-16 NOTE — ED Provider Notes (Addendum)
[facility] - [facility]   MRN: [id number] DOB: 1975/08/31  Subjective:   Patricia Simon is a 46 y.o. female presenting for persistent intermittent racing heartbeat and associated shortness of breath.  She became really concerned that she started to have chest pain that went to her shoulder with the racing heartbeat and shortness of breath.  She looked at her watch and showed a heart rate of 140 bpm.  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  This also happened when she was pregnant.  Symptoms came to ahead when she was also pregnant and she ended up having a natural birth the same day.  Has not had a recurrence until the past month or so.  She did go see her PCP recently and underwent a complete physical with labs.  She reports that everything was generally okay except for her cholesterol.  Personal history of atrial fibrillation, atrial flutter, SVT.  She does not have a cardiologist.  No history of thyroid issues, endocrine issues, pheochromocytoma.  Patient is not a smoker, does not vape, no drug use.  In the past 2 months she did get started on a combination blood pressure medication lisinopril hydrochlorothiazide.  She did report at the time that she was having a difficult time staying asleep as she would wake up and would feel very anxious, had racing thoughts.  During the day she cannot really say that she feels anxious or overtly stressed out although she has slightly more significant stressors than normal.  Her PCP did start her on Lexapro but she had significant side effects with this so she stopped it and was switched to Wellbutrin.  She has been taking that for the past 3 weeks.  No current facility-administered medications for this encounter.  Current Outpatient Medications:    buPROPion HCl (WELLBUTRIN PO), Take by mouth., Disp: , Rfl:    lisinopril-hydrochlorothiazide (ZESTORETIC) 10-12.5 MG tablet, Take 1 tablet by mouth daily., Disp: ,  Rfl:    Alum & Mag Hydroxide-Simeth (MAGIC MOUTHWASH W/LIDOCAINE) SOLN, Take 15 mLs by mouth 4 (four) times daily as needed. (Patient not taking: Reported on 06/19/2014), Disp: 100 mL, Rfl: 0   Allergies  Allergen Reactions   Nutritional Supplements Anaphylaxis   Codeine Rash    Past Medical History:  Diagnosis Date   Arrhythmia    while pregant with second child     Past Surgical History:  Procedure Laterality Date   CHOLECYSTECTOMY      Family History  Problem Relation Age of Onset   Heart failure Mother    Cancer Mother    Cancer Father     Social History   Tobacco Use   Smoking status: Never   Smokeless tobacco: Never  Vaping Use   Vaping Use: Never used  Substance Use Topics   Alcohol use: Yes    Alcohol/week: 1.0 standard drink of alcohol    Types: 1 Glasses of wine per week    Comment: a month   Drug use: No    ROS   Objective:   Vitals: BP (!) 131/92 (BP Location: Right Arm)   Pulse 96   Temp 98.2 F (36.8 C) (Oral)   Resp 16   SpO2 95%   Physical Exam Constitutional:      General: She is not in acute distress.    Appearance: Normal appearance. She is well-developed. She is not ill-appearing, toxic-appearing or diaphoretic.  HENT:     Head: Normocephalic and atraumatic.  Nose: Nose normal.     Mouth/Throat:     Mouth: Mucous membranes are moist.  Eyes:     General: No scleral icterus.       Right eye: No discharge.        Left eye: No discharge.     Extraocular Movements: Extraocular movements intact.  Neck:     Thyroid: No thyroid mass, thyromegaly or thyroid tenderness.  Cardiovascular:     Rate and Rhythm: Normal rate and regular rhythm.     Heart sounds: Normal heart sounds. No murmur heard.    No friction rub. No gallop.  Pulmonary:     Effort: Pulmonary effort is normal. No respiratory distress.     Breath sounds: No stridor. No wheezing, rhonchi or rales.  Chest:     Chest wall: No tenderness.  Skin:    General: Skin is  warm and dry.  Neurological:     General: No focal deficit present.     Mental Status: She is alert and oriented to person, place, and time.  Psychiatric:        Mood and Affect: Mood normal.        Behavior: Behavior normal.     ED ECG REPORT   Date: 08/16/2021  EKG Time: 4:48 PM  Rate: 86 bpm  Rhythm: normal sinus rhythm,  unchanged from previous tracings  Axis: Normal  Intervals:none  ST&T Change: T wave flattening in lead aVL  Narrative Interpretation: Sinus rhythm at 86 bpm with nonspecific T wave changes above.  Very comparable to previous EKG.  Assessment and Plan :   PDMP not reviewed this encounter.  1. Racing heart beat   2. History of cardiac arrhythmia   3. Shortness of breath    EKG is reassuring.  Patient has hemodynamically stable vital signs.  I did recommend a consultation with a cardiologist and placed an internal referral.  I advised that she follow-up with her PCP as soon as possible as well for a recheck and considering an alternative medication besides Wellbutrin.  Maintain blood pressure medications.  No signs of an acute cardiopulmonary event at this time.  Low Wells score and therefore I do not suspect pulmonary embolism.  Counseled patient on potential for adverse effects with medications prescribed/recommended today, ER and return-to-clinic precautions discussed, patient verbalized understanding.     Wallis Bamberg, PA-C 08/16/21 1650

## 2021-08-16 NOTE — ED Notes (Signed)
Past 48hrs patient feel like she has had a rapid heart rate, Yesterday her Apple Watch alerted her that heart rate was over 120 bmp. Checked her heart rate on medical equipment in office and it was 148 bmp. Watch alerted her this morning of an irregular heart rhythm. Patient notices that she is "holding my breath" that she has to tell herself to take deep breathes. This morning had some left shoulder pain that went into the neck.

## 2021-08-16 NOTE — ED Triage Notes (Signed)
The patient states she began having chest pain that radiated to her shoulder today (no longer present). She states her watch alerted her that her HR was about 140 BPM, the patient states she feels like she has to take more deep breaths when the HR is elevated.

## 2021-08-29 ENCOUNTER — Other Ambulatory Visit: Payer: Self-pay | Admitting: Family Medicine

## 2021-09-10 ENCOUNTER — Ambulatory Visit: Payer: Commercial Managed Care - PPO | Admitting: Interventional Cardiology

## 2021-12-05 ENCOUNTER — Other Ambulatory Visit: Payer: Self-pay | Admitting: Obstetrics and Gynecology

## 2021-12-05 DIAGNOSIS — R928 Other abnormal and inconclusive findings on diagnostic imaging of breast: Secondary | ICD-10-CM

## 2021-12-25 ENCOUNTER — Ambulatory Visit
Admission: RE | Admit: 2021-12-25 | Discharge: 2021-12-25 | Disposition: A | Discharge status: Home / Self Care | Payer: Commercial Managed Care - PPO | Source: Ambulatory Visit | Attending: Obstetrics and Gynecology | Admitting: Obstetrics and Gynecology

## 2021-12-25 DIAGNOSIS — R928 Other abnormal and inconclusive findings on diagnostic imaging of breast: Secondary | ICD-10-CM

## 2021-12-26 ENCOUNTER — Other Ambulatory Visit: Payer: Self-pay | Admitting: Obstetrics and Gynecology

## 2021-12-26 DIAGNOSIS — N6002 Solitary cyst of left breast: Secondary | ICD-10-CM

## 2022-06-25 ENCOUNTER — Other Ambulatory Visit: Payer: Self-pay | Admitting: Obstetrics and Gynecology

## 2022-06-25 DIAGNOSIS — N6001 Solitary cyst of right breast: Secondary | ICD-10-CM

## 2022-07-02 ENCOUNTER — Ambulatory Visit
Admission: RE | Admit: 2022-07-02 | Discharge: 2022-07-02 | Disposition: A | Discharge status: Home / Self Care | Payer: Commercial Managed Care - PPO | Source: Ambulatory Visit | Attending: Obstetrics and Gynecology | Admitting: Obstetrics and Gynecology

## 2022-07-02 DIAGNOSIS — N6001 Solitary cyst of right breast: Secondary | ICD-10-CM

## 2022-11-25 ENCOUNTER — Encounter: Payer: Self-pay | Admitting: Family Medicine

## 2022-11-26 ENCOUNTER — Other Ambulatory Visit: Payer: Self-pay | Admitting: Family Medicine

## 2022-11-26 ENCOUNTER — Ambulatory Visit
Admission: RE | Admit: 2022-11-26 | Discharge: 2022-11-26 | Disposition: A | Discharge status: Home / Self Care | Payer: No Typology Code available for payment source | Source: Ambulatory Visit | Attending: Family Medicine | Admitting: Family Medicine

## 2022-11-26 DIAGNOSIS — M79604 Pain in right leg: Secondary | ICD-10-CM

## 2022-11-26 DIAGNOSIS — G629 Polyneuropathy, unspecified: Secondary | ICD-10-CM
